# Patient Record
Sex: Female | Born: 1975 | Race: Black or African American | Hispanic: No | Marital: Single | Smoking: Never smoker
Health system: Southern US, Community
[De-identification: ages and names within clinical notes are randomized; demographics above are authoritative.]

## PROBLEM LIST (undated history)

## (undated) DIAGNOSIS — G7000 Myasthenia gravis without (acute) exacerbation: Secondary | ICD-10-CM

## (undated) DIAGNOSIS — E039 Hypothyroidism, unspecified: Secondary | ICD-10-CM

## (undated) HISTORY — PX: OTHER SURGICAL HISTORY: SHX169

## (undated) HISTORY — PX: THYMECTOMY: SHX1063

---

## 2009-11-06 ENCOUNTER — Emergency Department (HOSPITAL_BASED_OUTPATIENT_CLINIC_OR_DEPARTMENT_OTHER): Admission: EM | Admit: 2009-11-06 | Discharge: 2009-11-06 | Payer: Self-pay | Admitting: Emergency Medicine

## 2009-12-09 ENCOUNTER — Ambulatory Visit (HOSPITAL_COMMUNITY): Admission: RE | Admit: 2009-12-09 | Discharge: 2009-12-09 | Payer: Self-pay | Admitting: Obstetrics and Gynecology

## 2010-01-30 ENCOUNTER — Emergency Department (HOSPITAL_BASED_OUTPATIENT_CLINIC_OR_DEPARTMENT_OTHER): Admission: EM | Admit: 2010-01-30 | Discharge: 2010-01-30 | Payer: Self-pay | Admitting: Emergency Medicine

## 2010-06-24 LAB — GLUCOSE, CAPILLARY: Glucose-Capillary: 82 mg/dL (ref 70–99)

## 2010-06-24 LAB — URINALYSIS, ROUTINE W REFLEX MICROSCOPIC
Bilirubin Urine: NEGATIVE
Glucose, UA: NEGATIVE mg/dL
Ketones, ur: 15 mg/dL — AB
pH: 6 (ref 5.0–8.0)

## 2010-06-24 LAB — URINE MICROSCOPIC-ADD ON

## 2010-06-24 LAB — URINE CULTURE

## 2011-02-06 DIAGNOSIS — G7000 Myasthenia gravis without (acute) exacerbation: Secondary | ICD-10-CM | POA: Insufficient documentation

## 2013-06-10 ENCOUNTER — Emergency Department (HOSPITAL_BASED_OUTPATIENT_CLINIC_OR_DEPARTMENT_OTHER): Payer: Medicaid Other

## 2013-06-10 ENCOUNTER — Emergency Department (HOSPITAL_BASED_OUTPATIENT_CLINIC_OR_DEPARTMENT_OTHER)
Admission: EM | Admit: 2013-06-10 | Discharge: 2013-06-10 | Disposition: A | Discharge status: Home / Self Care | Payer: Medicaid Other | Attending: Emergency Medicine | Admitting: Emergency Medicine

## 2013-06-10 ENCOUNTER — Encounter (HOSPITAL_BASED_OUTPATIENT_CLINIC_OR_DEPARTMENT_OTHER): Payer: Self-pay | Admitting: Emergency Medicine

## 2013-06-10 DIAGNOSIS — O9853 Other viral diseases complicating the puerperium: Secondary | ICD-10-CM | POA: Insufficient documentation

## 2013-06-10 DIAGNOSIS — Z862 Personal history of diseases of the blood and blood-forming organs and certain disorders involving the immune mechanism: Secondary | ICD-10-CM | POA: Insufficient documentation

## 2013-06-10 DIAGNOSIS — M539 Dorsopathy, unspecified: Principal | ICD-10-CM

## 2013-06-10 DIAGNOSIS — M899 Disorder of bone, unspecified: Principal | ICD-10-CM | POA: Insufficient documentation

## 2013-06-10 DIAGNOSIS — B9789 Other viral agents as the cause of diseases classified elsewhere: Secondary | ICD-10-CM | POA: Insufficient documentation

## 2013-06-10 DIAGNOSIS — Z3202 Encounter for pregnancy test, result negative: Secondary | ICD-10-CM | POA: Insufficient documentation

## 2013-06-10 DIAGNOSIS — Z8669 Personal history of other diseases of the nervous system and sense organs: Secondary | ICD-10-CM | POA: Insufficient documentation

## 2013-06-10 DIAGNOSIS — M549 Dorsalgia, unspecified: Secondary | ICD-10-CM | POA: Insufficient documentation

## 2013-06-10 DIAGNOSIS — O9989 Other specified diseases and conditions complicating pregnancy, childbirth and the puerperium: Secondary | ICD-10-CM | POA: Insufficient documentation

## 2013-06-10 DIAGNOSIS — B349 Viral infection, unspecified: Secondary | ICD-10-CM

## 2013-06-10 DIAGNOSIS — R911 Solitary pulmonary nodule: Secondary | ICD-10-CM | POA: Insufficient documentation

## 2013-06-10 DIAGNOSIS — M259 Joint disorder, unspecified: Secondary | ICD-10-CM | POA: Insufficient documentation

## 2013-06-10 DIAGNOSIS — Z8639 Personal history of other endocrine, nutritional and metabolic disease: Secondary | ICD-10-CM | POA: Insufficient documentation

## 2013-06-10 DIAGNOSIS — M7918 Myalgia, other site: Secondary | ICD-10-CM

## 2013-06-10 DIAGNOSIS — R109 Unspecified abdominal pain: Secondary | ICD-10-CM | POA: Insufficient documentation

## 2013-06-10 DIAGNOSIS — R509 Fever, unspecified: Secondary | ICD-10-CM

## 2013-06-10 HISTORY — DX: Myasthenia gravis without (acute) exacerbation: G70.00

## 2013-06-10 HISTORY — DX: Hypothyroidism, unspecified: E03.9

## 2013-06-10 LAB — PREGNANCY, URINE: Preg Test, Ur: NEGATIVE

## 2013-06-10 LAB — URINALYSIS, ROUTINE W REFLEX MICROSCOPIC
Glucose, UA: NEGATIVE mg/dL
KETONES UR: 15 mg/dL — AB
Leukocytes, UA: NEGATIVE
Nitrite: NEGATIVE
PH: 6 (ref 5.0–8.0)
Protein, ur: >300 mg/dL — AB
SPECIFIC GRAVITY, URINE: 1.031 — AB (ref 1.005–1.030)
UROBILINOGEN UA: 1 mg/dL (ref 0.0–1.0)

## 2013-06-10 LAB — URINE MICROSCOPIC-ADD ON

## 2013-06-10 MED ORDER — METHOCARBAMOL 500 MG PO TABS: 500.0000 mg | ORAL_TABLET | Freq: Two times a day (BID) | ORAL | Status: DC

## 2013-06-10 MED ORDER — NAPROXEN 500 MG PO TABS: 500.0000 mg | ORAL_TABLET | Freq: Two times a day (BID) | ORAL | Status: DC

## 2013-06-10 NOTE — ED Provider Notes (Signed)
Patient CSN: [id number]     Arrival date & time 06/10/13  1521 History   First MD Initiated Contact with Patient 06/10/13 1541     Chief Complaint  Patient presents with  . Back Pain      HPI  Presents with fever and bodyaches and right back and flank pain for the last 2 days. Has felt chilled at home. No frank riders. No cough or chest pain no shortness of breath or pleuritic discomfort. She is proximally 5 weeks status post vaginal delivery. No dysuria. Is currently menstruating/vs Lochia. No nausea or vomiting. No chest pain shortness of breath or cough.  Past Medical History  Diagnosis Date  . Myasthenia gravis   . Hypothyroid    Past Surgical History  Procedure Laterality Date  . Thymectomy      1995   History reviewed. No pertinent family history. History  Substance Use Topics  . Smoking status: Never Smoker   . Smokeless tobacco: Not on file  . Alcohol Use: No   OB History   Grav Para Term Preterm Abortions TAB SAB Ect Mult Living                 Review of Systems  Constitutional: Positive for fever. Negative for chills, diaphoresis, appetite change and fatigue.  HENT: Negative for mouth sores, sore throat and trouble swallowing.   Eyes: Negative for visual disturbance.  Respiratory: Negative for cough, chest tightness, shortness of breath and wheezing.   Cardiovascular: Negative for chest pain.  Gastrointestinal: Negative for nausea, vomiting, abdominal pain, diarrhea and abdominal distention.  Endocrine: Negative for polydipsia, polyphagia and polyuria.  Genitourinary: Positive for flank pain. Negative for dysuria, frequency and hematuria.  Musculoskeletal: Negative for gait problem.  Skin: Negative for color change, pallor and rash.  Neurological: Negative for dizziness, syncope, light-headedness and headaches.  Hematological: Does not bruise/bleed easily.  Psychiatric/Behavioral: Negative for behavioral problems and confusion.      Allergies    Magnesium-containing compounds  Home Medications   Current Outpatient Rx  Name  Route  Sig  Dispense  Refill  . methocarbamol (ROBAXIN) 500 MG tablet   Oral   Take 1 tablet (500 mg total) by mouth 2 (two) times daily.   20 tablet   0   . naproxen (NAPROSYN) 500 MG tablet   Oral   Take 1 tablet (500 mg total) by mouth 2 (two) times daily.   30 tablet   0    There were no vitals taken for this visit. Physical Exam  Constitutional: She is oriented to person, place, and time. She appears well-developed and well-nourished. No distress.  HENT:  Head: Normocephalic.  Eyes: Conjunctivae are normal. Pupils are equal, round, and reactive to light. No scleral icterus.  Neck: Normal range of motion. Neck supple. No thyromegaly present.  Cardiovascular: Normal rate and regular rhythm.  Exam reveals no gallop and no friction rub.   No murmur heard. Pulmonary/Chest: Effort normal and breath sounds normal. No respiratory distress. She has no wheezes. She has no rales.    Clear lungs. No abnormal breath sounds. No increased worker breathing.  Abdominal: Soft. Bowel sounds are normal. She exhibits no distension. There is no tenderness. There is no rebound.  Musculoskeletal: Normal range of motion.  Neurological: She is alert and oriented to person, place, and time.  Skin: Skin is warm and dry. No rash noted.  Psychiatric: She has a normal mood and affect. Her behavior is normal.    ED  Course  Procedures (including critical care time) Labs Review Labs Reviewed  URINALYSIS, ROUTINE W REFLEX MICROSCOPIC - Abnormal; Notable for the following:    Color, Urine AMBER (*)    APPearance CLOUDY (*)    Specific Gravity, Urine 1.031 (*)    Hgb urine dipstick SMALL (*)    Bilirubin Urine SMALL (*)    Ketones, ur 15 (*)    Protein, ur >300 (*)    All other components within normal limits  URINE MICROSCOPIC-ADD ON - Abnormal; Notable for the following:    Bacteria, UA MANY (*)    Casts  GRANULAR CAST (*)    All other components within normal limits  URINE CULTURE  PREGNANCY, URINE   Imaging Review Ct Abdomen Pelvis Wo Contrast  06/10/2013   CLINICAL DATA:  Hematuria  EXAM: CT ABDOMEN AND PELVIS WITHOUT CONTRAST  TECHNIQUE: Multidetector CT imaging of the abdomen and pelvis was performed following the standard protocol without intravenous contrast.  COMPARISON:  None.  FINDINGS: A 8 mm sub solid pulmonary nodule projects adjacent to a vessel within the anterior base of the lingula. Image 16 series 4, and image 53 series 5.  These findings are obtained within the limitations of a noncontrast CT.  Noncontrast evaluation of the liver, spleen, adrenals, pancreas is unremarkable.  There is no evidence of hydronephrosis, nephrolithiasis, ureterolithiasis, nor hydroureter.  There is no evidence of bowel obstruction, enteritis, colitis, diverticulitis, nor appendicitis. The appendix is identified and is unremarkable.  There is no evidence of abdominal aortic aneurysm.  There is no evidence of abdominal or pelvic free fluid, loculated fluid collections, masses, nor adenopathy.  A small fat containing umbilical hernia is appreciated. Image 50 series 2. There is no evidence of an inguinal hernia.  The osseous structures demonstrate no evidence of aggressive appearing lesions.  IMPRESSION: 1. 8 mm pulmonary nodule anterior base of the lingula. Further evaluation with nonemergent dedicated chest CT is recommended. 2. No evidence of obstructive inflammatory abnormalities. Fact containing umbilical hernia.   Electronically Signed   By: Salome Holmes M.D.   On: 06/10/2013 17:54   Dg Chest 2 View  06/10/2013   CLINICAL DATA:  Back pain, headaches, chills and sweats.  EXAM: CHEST  2 VIEW  COMPARISON:  Chest CTA 11/29/2009  FINDINGS: Prior median sternotomy is noted. The cardiac silhouette is upper limits of normal in size. The lungs are well inflated and clear. There is no evidence of pleural effusion or  pneumothorax. No acute osseous abnormality is seen.  IMPRESSION: No active cardiopulmonary disease.   Electronically Signed   By: Sebastian Ache   On: 06/10/2013 17:10     EKG Interpretation None      MDM   Final diagnoses:  Musculoskeletal pain  Fever  Viral syndrome  Pulmonary nodule    No infiltrate on chest x-ray. No stone on CT. No overt signs of infection. I think that her pain is likely musculoskeletal and that desk, and the fever, or from a viral syndrome. I discussed her pulmonary nodule with her. I referred her to her primary care physician to request outpatient CT of the chest to further delineate this. This is given to her in written form. She expressed understanding of the importance of followup for this.    Rolland Porter, MD 06/10/13 [phone]

## 2013-06-10 NOTE — ED Notes (Signed)
Pt reports back pain, headaches, chills and sweats since Monday.  Took ibuprofen 800mg  at 1pm today.

## 2013-06-10 NOTE — Discharge Instructions (Signed)
Rest and fluids. Medications for body aches. Contact your doctor for a followup visit to discuss CT scan of the chest to further evaluate the pulmonary nodule noted on your CT scan today.   Fever, Adult A fever is a temperature of 100.4 F (38 C) or above.  HOME CARE  Take fever medicine as told by your doctor. Do not  take aspirin for fever if you are younger than 36 years of age.  If you are given antibiotic medicine, take it as told. Finish the medicine even if you start to feel better.  Rest.  Drink enough fluids to keep your pee (urine) clear or pale yellow. Do not drink alcohol.  Take a bath or shower with room temperature water. Do not use ice water or alcohol sponge baths.  Wear lightweight, loose clothes. GET HELP RIGHT AWAY IF:   You are short of breath or have trouble breathing.  You are very weak.  You are dizzy or you pass out (faint).  You are very thirsty or are making little or no urine.  You have new pain.  You throw up (vomit) or have watery poop (diarrhea).  You keep throwing up or having watery poop for more than 1 to 2 days.  You have a stiff neck or light bothers your eyes.  You have a skin rash.  You have a fever or problems (symptoms) that last for more than 2 to 3 days.  You have a fever and your problems quickly get worse.  You keep throwing up the fluids you drink.  You do not feel better after 3 days.  You have new problems. MAKE SURE YOU:   Understand these instructions.  Will watch your condition.  Will get help right away if you are not doing well or get worse. Document Released: 01/03/2008 Document Revised: 06/18/2011 Document Reviewed: 01/25/2011 [facility] Patient Information 2014 [place], [place].  Myalgia, Adult Myalgia is the medical term for muscle pain. It is a symptom of many things. Nearly everyone at some time in their life has this. The most common cause for muscle pain is overuse or straining and more so when you  are not in shape. Injuries and muscle bruises cause myalgias. Muscle pain without a history of injury can also be caused by a virus. It frequently comes along with the flu. Myalgia not caused by muscle strain can be present in a large number of infectious diseases. Some autoimmune diseases like lupus and fibromyalgia can cause muscle pain. Myalgia may be mild, or severe. SYMPTOMS  The symptoms of myalgia are simply muscle pain. Most of the time this is short lived and the pain goes away without treatment. DIAGNOSIS  Myalgia is diagnosed by your caregiver by taking your history. This means you tell him when the problems began, what they are, and what has been happening. If this has not been a long term problem, your caregiver may want to watch for a while to see what will happen. If it has been long term, they may want to do additional testing. TREATMENT  The treatment depends on what the underlying cause of the muscle pain is. Often anti-inflammatory medications will help. HOME CARE INSTRUCTIONS  If the pain in your muscles came from overuse, slow down your activities until the problems go away.  Myalgia from overuse of a muscle can be treated with alternating hot and cold packs on the muscle affected or with cold for the first couple days. If either heat or cold seems  to make things worse, stop their use.  Apply ice to the sore area for 15-20 minutes, 03-04 times per day, while awake for the first 2 days of muscle soreness, or as directed. Put the ice in a plastic bag and place a towel between the bag of ice and your skin.  Only take over-the-counter or prescription medicines for pain, discomfort, or fever as directed by your caregiver.  Regular gentle exercise may help if you are not active.  Stretching before strenuous exercise can help lower the risk of myalgia. It is normal when beginning an exercise regimen to feel some muscle pain after exercising. Muscles that have not been used frequently  will be sore at first. If the pain is extreme, this may mean injury to a muscle. SEEK MEDICAL CARE IF:  You have an increase in muscle pain that is not relieved with medication.  You begin to run a temperature.  You develop nausea and vomiting.  You develop a stiff and painful neck.  You develop a rash.  You develop muscle pain after a tick bite.  You have continued muscle pain while working out even after you are in good condition. SEEK IMMEDIATE MEDICAL CARE IF: Any of your problems are getting worse and medications are not helping. MAKE SURE YOU:   Understand these instructions.  Will watch your condition.  Will get help right away if you are not doing well or get worse. Document Released: 02/15/2006 Document Revised: 06/18/2011 Document Reviewed: 05/07/2006 [facility] Patient Information 2014 [place], [place].  Pulmonary Nodule  A pulmonary nodule is a small, round spot in your lung. It is usually found when pictures of your lungs are taken for other reasons. Most pulmonary nodules are not cancerous and do not cause symptoms. Tests will be done to make sure the nodule is not cancerous. Pulmonary nodules that are not cancerous usually do not require treatment. HOME CARE   Only take medicine as told by your doctor.  Follow up with your doctor as told. GET HELP IF:  You have trouble breathing when doing activities.  You feel sick or more tired than normal.  You do not feel like eating.  You lose weight without trying to.  You have chills.  You have night sweats. GET HELP RIGHT AWAY IF:  You cannot catch your breath.  You start making whistling sounds when breathing (wheezing).  You have a cough that does not go away.  You cough up blood.  You are dizzy or feel like you are going to pass out.  You have sudden chest pain.  You have a fever or lasting symptoms for more than 2 3 days.  You have a fever and your symptoms suddenly get worse. MAKE SURE  YOU:  Understand these instructions.  Will watch your condition.  Will get help right away if you are not doing well or get worse. Document Released: 04/28/2010 Document Revised: 11/26/2012 Document Reviewed: 09/15/2012 [facility] Patient Information 2014 [place], [place].  Viral Infections A viral infection can be caused by different types of viruses.Most viral infections are not serious and resolve on their own. However, some infections may cause severe symptoms and may lead to further complications. SYMPTOMS Viruses can frequently cause:  Minor sore throat.  Aches and pains.  Headaches.  Runny nose.  Different types of rashes.  Watery eyes.  Tiredness.  Cough.  Loss of appetite.  Gastrointestinal infections, resulting in nausea, vomiting, and diarrhea. These symptoms do not respond to antibiotics because the infection  is not caused by bacteria. However, you might catch a bacterial infection following the viral infection. This is sometimes called a "superinfection." Symptoms of such a bacterial infection may include:  Worsening sore throat with pus and difficulty swallowing.  Swollen neck glands.  Chills and a high or persistent fever.  Severe headache.  Tenderness over the sinuses.  Persistent overall ill feeling (malaise), muscle aches, and tiredness (fatigue).  Persistent cough.  Yellow, green, or brown mucus production with coughing. HOME CARE INSTRUCTIONS   Only take over-the-counter or prescription medicines for pain, discomfort, diarrhea, or fever as directed by your caregiver.  Drink enough water and fluids to keep your urine clear or pale yellow. Sports drinks can provide valuable electrolytes, sugars, and hydration.  Get plenty of rest and maintain proper nutrition. Soups and broths with crackers or rice are fine. SEEK IMMEDIATE MEDICAL CARE IF:   You have severe headaches, shortness of breath, chest pain, neck pain, or an unusual rash.  You  have uncontrolled vomiting, diarrhea, or you are unable to keep down fluids.  You or your child has an oral temperature above 102 F (38.9 C), not controlled by medicine.  Your baby is older than 3 months with a rectal temperature of 102 F (38.9 C) or higher.  Your baby is 83 months old or younger with a rectal temperature of 100.4 F (38 C) or higher. MAKE SURE YOU:   Understand these instructions.  Will watch your condition.  Will get help right away if you are not doing well or get worse. Document Released: 01/03/2005 Document Revised: 06/18/2011 Document Reviewed: 07/31/2010 [facility] Patient Information 2014 [place], [place].

## 2013-06-11 LAB — URINE CULTURE
COLONY COUNT: NO GROWTH
Culture: NO GROWTH

## 2014-11-13 IMAGING — CT CT ABD-PELV W/O CM
2 of 4 series · 17 of 46 positions shown, 19 images · non-contrast
Comparison: None.

CLINICAL DATA: Hematuria

EXAM:
CT ABDOMEN AND PELVIS WITHOUT CONTRAST
TECHNIQUE: Multidetector CT imaging of the abdomen and pelvis was performed
following the standard protocol without intravenous contrast.

[Series 2: renal stone < 200 lbs 5.0 b31f · axial · 0.90mm/px · z∈[-407,+8]mm · 14 of 91 slices shown, 16 images]
[im 4/91  soft-tissue]
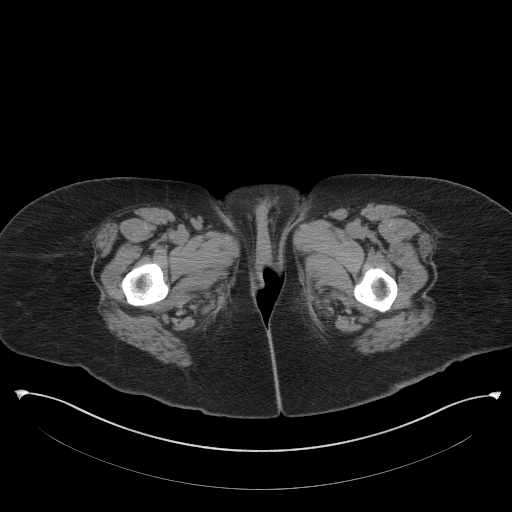
[im 4/91  bone]
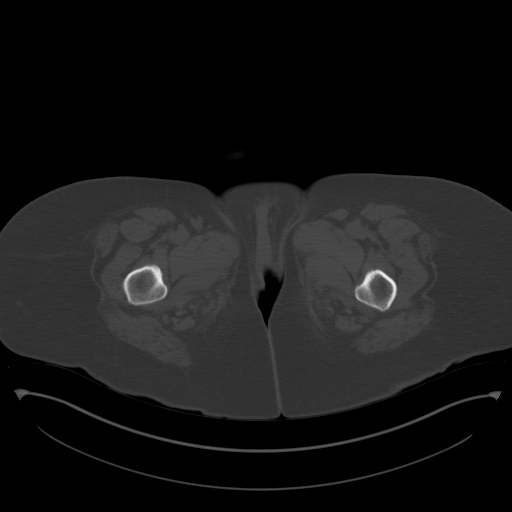
[im 12/91  soft-tissue]
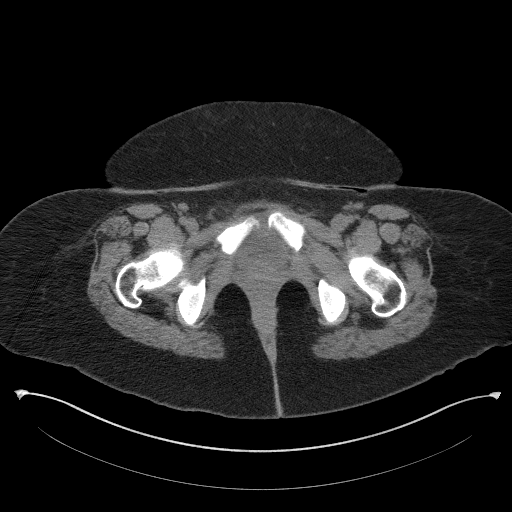
[im 16/91  soft-tissue]
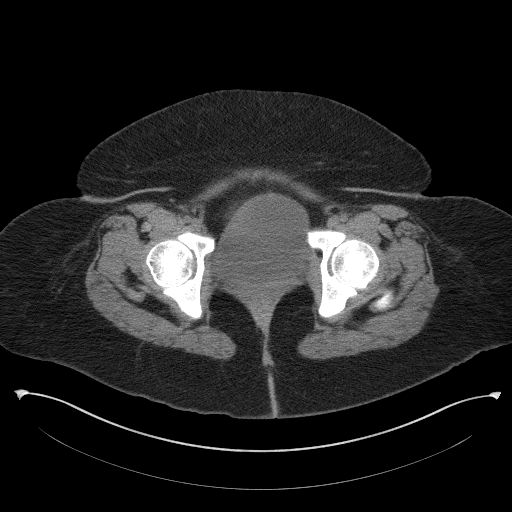
[im 24/91  soft-tissue]
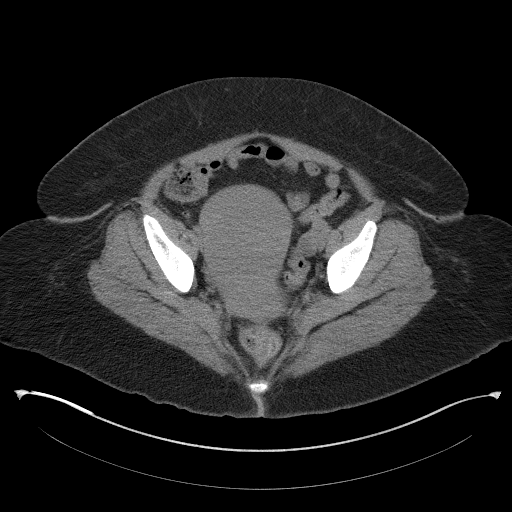
[im 32/91  soft-tissue]
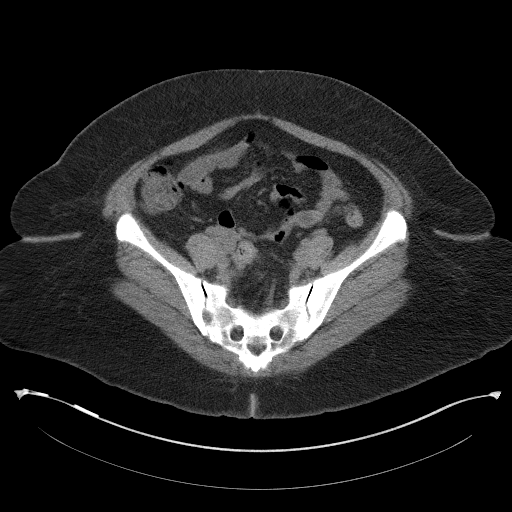
[im 36/91  soft-tissue]
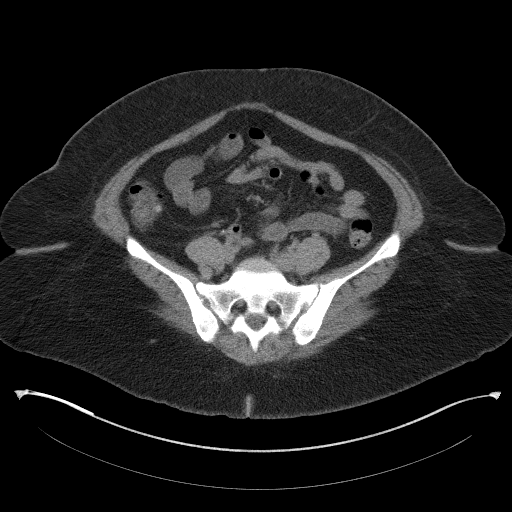
[im 44/91  soft-tissue]
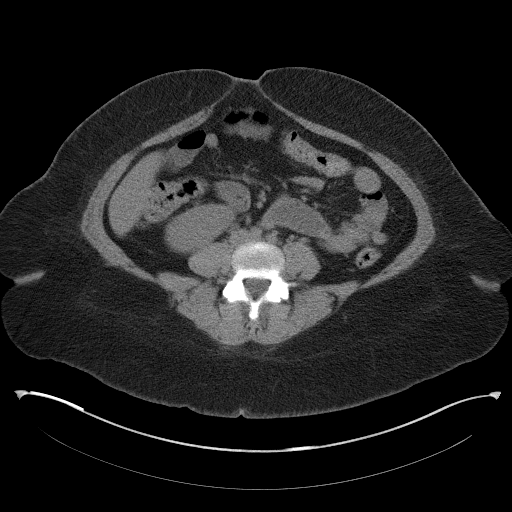
[im 47/91  soft-tissue]
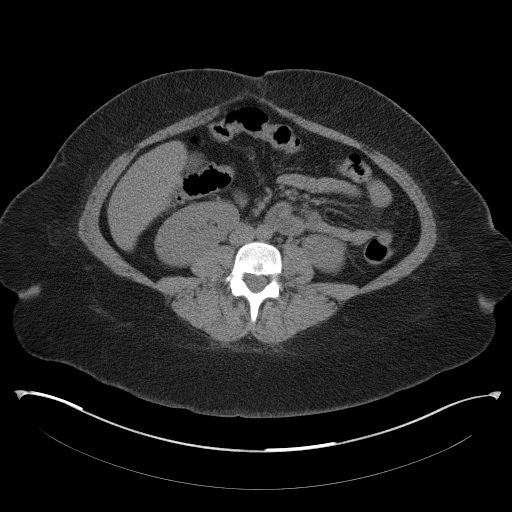
[im 55/91  soft-tissue]
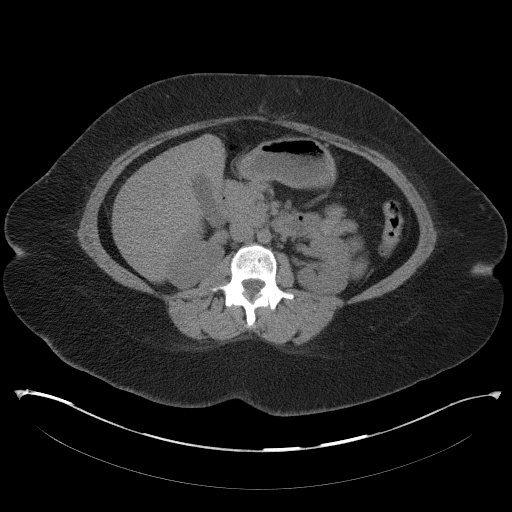
[im 55/91  bone]
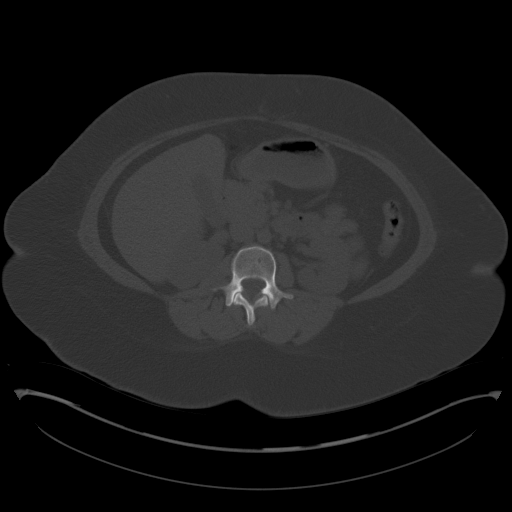
[im 59/91  soft-tissue]
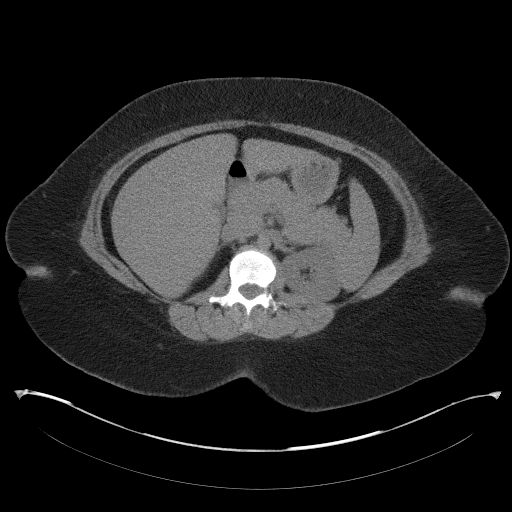
[im 67/91  soft-tissue]
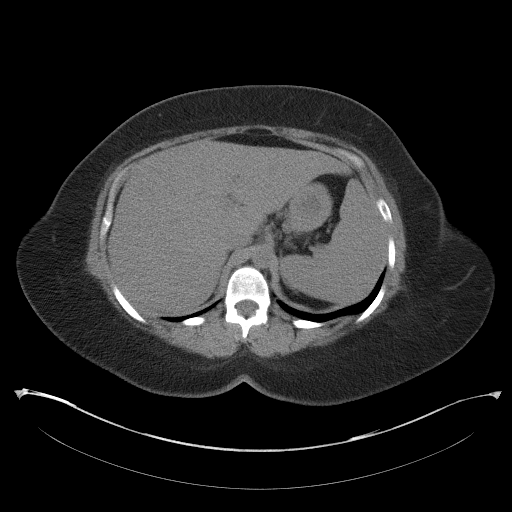
[im 75/91  soft-tissue]
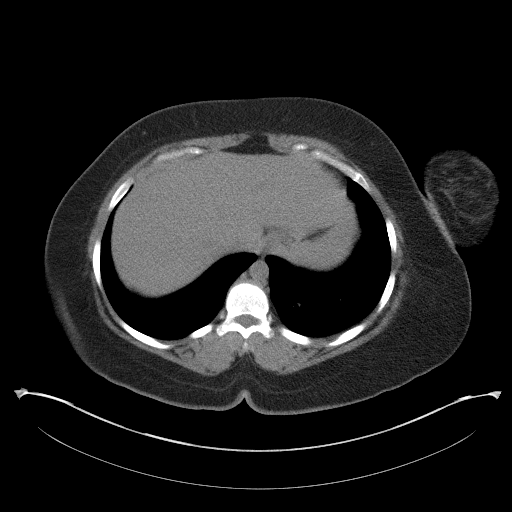
[im 79/91  soft-tissue]
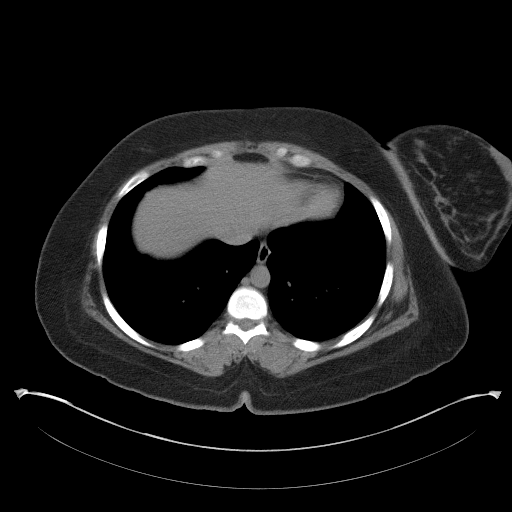
[im 87/91  soft-tissue]
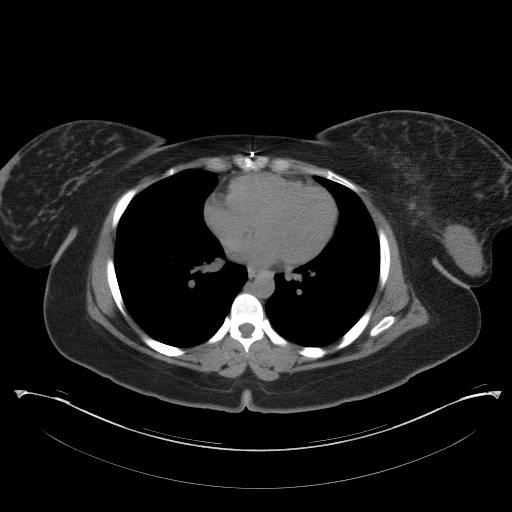

[Series 5: renal stone 3.0 coronal · coronal · 0.76mm/px · 3 of 103 slices shown]
[im 35/103  soft-tissue]
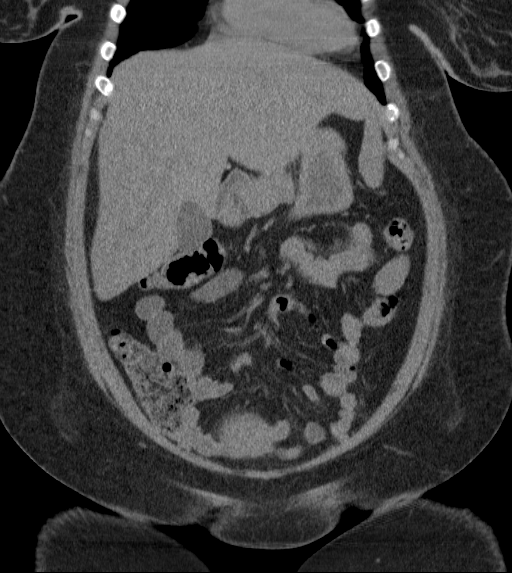
[im 46/103  soft-tissue]
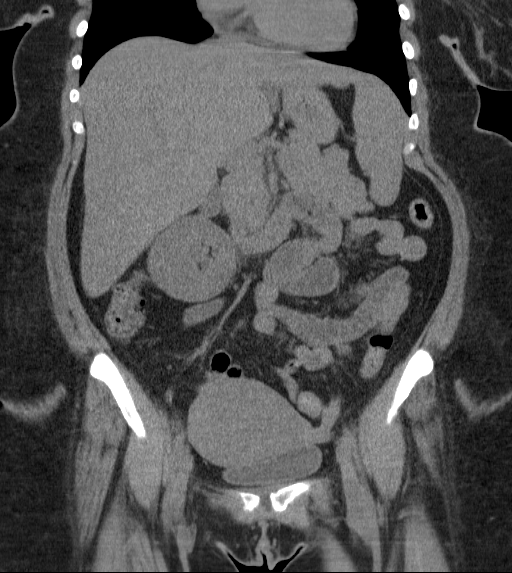
[im 57/103  soft-tissue]
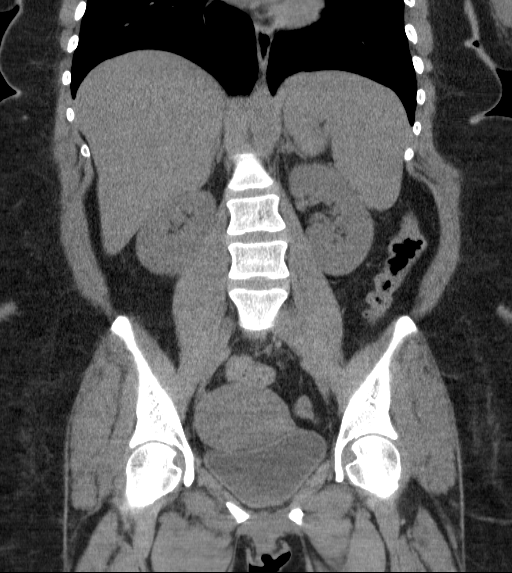

[17 of 46 positions shown; findings below may reference images not displayed]

FINDINGS: A 8 mm sub solid pulmonary nodule projects adjacent to a vessel
within the anterior base of the lingula. Image 16 series 4, and
image 53 series 5.

These findings are obtained within the limitations of a noncontrast
CT.

Noncontrast evaluation of the liver, spleen, adrenals, pancreas is
unremarkable.

There is no evidence of hydronephrosis, nephrolithiasis,
ureterolithiasis, nor hydroureter.

There is no evidence of bowel obstruction, enteritis, colitis,
diverticulitis, nor appendicitis. The appendix is identified and is
unremarkable.

There is no evidence of abdominal aortic aneurysm.

There is no evidence of abdominal or pelvic free fluid, loculated
fluid collections, masses, nor adenopathy.

A small fat containing umbilical hernia is appreciated. Image 50
series 2. There is no evidence of an inguinal hernia.

The osseous structures demonstrate no evidence of aggressive
appearing lesions.
IMPRESSION: 1. 8 mm pulmonary nodule anterior base of the lingula. Further
evaluation with nonemergent dedicated chest CT is recommended.
2. No evidence of obstructive inflammatory abnormalities. Fact
containing umbilical hernia.

## 2015-05-18 DIAGNOSIS — E039 Hypothyroidism, unspecified: Secondary | ICD-10-CM | POA: Insufficient documentation

## 2017-05-04 ENCOUNTER — Emergency Department (HOSPITAL_BASED_OUTPATIENT_CLINIC_OR_DEPARTMENT_OTHER)
Admission: EM | Admit: 2017-05-04 | Discharge: 2017-05-04 | Disposition: A | Discharge status: Home / Self Care | Payer: Medicaid Other | Attending: Emergency Medicine | Admitting: Emergency Medicine

## 2017-05-04 ENCOUNTER — Other Ambulatory Visit: Payer: Self-pay

## 2017-05-04 ENCOUNTER — Encounter (HOSPITAL_BASED_OUTPATIENT_CLINIC_OR_DEPARTMENT_OTHER): Payer: Self-pay | Admitting: Emergency Medicine

## 2017-05-04 DIAGNOSIS — Z79899 Other long term (current) drug therapy: Secondary | ICD-10-CM | POA: Insufficient documentation

## 2017-05-04 DIAGNOSIS — H6691 Otitis media, unspecified, right ear: Secondary | ICD-10-CM | POA: Insufficient documentation

## 2017-05-04 DIAGNOSIS — E039 Hypothyroidism, unspecified: Secondary | ICD-10-CM | POA: Diagnosis not present

## 2017-05-04 DIAGNOSIS — M545 Low back pain, unspecified: Secondary | ICD-10-CM

## 2017-05-04 DIAGNOSIS — H6690 Otitis media, unspecified, unspecified ear: Secondary | ICD-10-CM

## 2017-05-04 DIAGNOSIS — H9201 Otalgia, right ear: Secondary | ICD-10-CM | POA: Diagnosis present

## 2017-05-04 LAB — PREGNANCY, URINE: Preg Test, Ur: NEGATIVE

## 2017-05-04 LAB — URINALYSIS, ROUTINE W REFLEX MICROSCOPIC
Bilirubin Urine: NEGATIVE
GLUCOSE, UA: NEGATIVE mg/dL
Ketones, ur: NEGATIVE mg/dL
LEUKOCYTES UA: NEGATIVE
Nitrite: NEGATIVE
PH: 6 (ref 5.0–8.0)
PROTEIN: NEGATIVE mg/dL
Specific Gravity, Urine: 1.02 (ref 1.005–1.030)

## 2017-05-04 LAB — URINALYSIS, MICROSCOPIC (REFLEX)
BACTERIA UA: NONE SEEN
RBC / HPF: NONE SEEN RBC/hpf (ref 0–5)
SQUAMOUS EPITHELIAL / LPF: NONE SEEN
WBC, UA: NONE SEEN WBC/hpf (ref 0–5)

## 2017-05-04 MED ORDER — ACETAMINOPHEN 500 MG PO TABS
1000.0000 mg | ORAL_TABLET | Freq: Once | ORAL | Status: AC
  Administered 2017-05-04: 1000 mg via ORAL
  Filled 2017-05-04: qty 2

## 2017-05-04 MED ORDER — AMOXICILLIN 500 MG PO CAPS: 500.0000 mg | ORAL_CAPSULE | Freq: Two times a day (BID) | ORAL | 0 refills | Status: AC

## 2017-05-04 NOTE — ED Notes (Signed)
Pt refused to get into hospital gown.

## 2017-05-04 NOTE — ED Provider Notes (Signed)
[facility] EMERGENCY DEPARTMENT Provider Note   CSN: [id number] Arrival date & time: 05/04/17  0901     History   Chief Complaint Chief Complaint  Patient presents with  . Otalgia    HPI  Teresa Mora is a 42 y.o. female with a history of myasthenia gravis and hypothyroidism s/p thyroidectomy, presents to the ED for evaluation of low back pain and right ear pain. Pt reports earlier this week she was dealing with upper respiratory symptoms which have since improved, and yesterday she was feeling completely better until around 11 PM when she started having pain in her right ear, which is made worse by swallowing.  Patient denies any pain in the left ear, no ear drainage or change in hearing.  Patient reports she was having nasal congestion, cough and sore throat with the symptoms have all improved with symptomatic treatment.  Patient denies any fevers or chills, no shortness of breath or chest pain.  Patient also complaining of some left-sided low back pain for the past 3-4 days, which she describes an intermittent ache that is worse with movement patient has had musculoskeletal back pain in the past but wanted to make sure this was not a UTI.  Patient denies any dysuria, reports she drinks a lot of water and urinates frequently but does not think this is out of the norm.  She denies any vaginal discharge or pelvic discomfort.  Pt has not tried anything to treat her symptoms prior to arrival.   Pertinent negatives include no fevers or chills, abdominal pain, dysuria, numbness, weakness or tingling of lower extremities, or loss of bowel or bladder control.         Past Medical History:  Diagnosis Date  . Hypothyroid   . Myasthenia gravis (HCC)     There are no active problems to display for this patient.   Past Surgical History:  Procedure Laterality Date  . THYMECTOMY     1995    OB History    No data available       Home Medications    Prior to Admission  medications   Medication Sig Start Date End Date Taking? Authorizing Provider  levothyroxine (SYNTHROID, LEVOTHROID) 112 MCG tablet Take 224 mcg by mouth daily before breakfast.   Yes [provider]  pyridostigmine (MESTINON) 180 MG CR tablet Take 180 mg by mouth at bedtime.   Yes [provider]  pyridostigmine (MESTINON) 60 MG tablet Take 60 mg by mouth daily as needed.   Yes [provider]  amoxicillin (AMOXIL) 500 MG capsule Take 1 capsule (500 mg total) by mouth 2 (two) times daily for 7 days. 05/04/17 05/11/17  Dartha Lodge, PA-C  methocarbamol (ROBAXIN) 500 MG tablet Take 1 tablet (500 mg total) by mouth 2 (two) times daily. 06/10/13   Rolland Porter, MD  naproxen (NAPROSYN) 500 MG tablet Take 1 tablet (500 mg total) by mouth 2 (two) times daily. 06/10/13   Rolland Porter, MD    Family History No family history on file.  Social History Social History   Tobacco Use  . Smoking status: Never Smoker  . Smokeless tobacco: Never Used  Substance Use Topics  . Alcohol use: No  . Drug use: No     Allergies   Magnesium-containing compounds   Review of Systems Review of Systems  Constitutional: Negative for chills and fever.  HENT: Positive for ear pain. Negative for congestion, rhinorrhea and sore throat.   Eyes: Negative for discharge,  redness and itching.  Respiratory: Negative for cough and shortness of breath.   Cardiovascular: Negative for chest pain.  Gastrointestinal: Negative for abdominal pain, constipation, diarrhea, nausea and vomiting.  Genitourinary: Negative for dysuria, flank pain, frequency, pelvic pain, vaginal bleeding and vaginal discharge.  Musculoskeletal: Positive for back pain. Negative for gait problem.  Skin: Negative for color change and rash.  Neurological: Negative for weakness and numbness.     Physical Exam Updated Vital Signs BP 128/78 (BP Location: Left Arm)   Pulse 77   Temp 98.9 F (37.2 C) (Oral)   Resp 16   Ht 5\' 3"   (1.6 m)   Wt 116.1 kg (256 lb)   LMP 04/27/2017   SpO2 100%   BMI 45.35 kg/m   Physical Exam  Constitutional: She appears well-developed and well-nourished. No distress.  HENT:  Head: Normocephalic and atraumatic.  Right ear minimal tenderness with palpation of the tragus and manipulation of the auricle, external auditory canal is unremarkable, TM is erythematous and bulging Left Ear no tenderness to palpation, EACs unremarkable, TM normal with good cone of light Nasal mucosa with moderate edema and some clear rhinorrhea Posterior oropharynx clear and moist no tonsillar edema or exudates, uvula midline  Eyes: Right eye exhibits no discharge. Left eye exhibits no discharge.  Neck: Neck supple.  Cardiovascular: Normal rate, regular rhythm and normal heart sounds.  Pulmonary/Chest: Effort normal and breath sounds normal. No stridor. No respiratory distress. She has no wheezes. She has no rales.  Abdominal: Soft. Bowel sounds are normal. She exhibits no distension and no mass. There is no tenderness. There is no guarding.  Musculoskeletal:  Lower back with mild tenderness to palpation over the left side of the back, no midline spinal tenderness over the lumbar spine, normal range of motion of lower extremities  Lymphadenopathy:    She has no cervical adenopathy.  Neurological: She is alert. Coordination normal.  Bilateral lower extremities with normal strength and sensation  Skin: Skin is warm and dry. She is not diaphoretic.  Psychiatric: She has a normal mood and affect. Her behavior is normal.  Nursing note and vitals reviewed.    ED Treatments / Results  Labs (all labs ordered are listed, but only abnormal results are displayed) Labs Reviewed  PREGNANCY, URINE  URINALYSIS, ROUTINE W REFLEX MICROSCOPIC    EKG  EKG Interpretation None       Radiology No results found.  Procedures Procedures (including critical care time)  Medications Ordered in ED Medications    acetaminophen (TYLENOL) tablet 1,000 mg (1,000 mg Oral Given 05/04/17 0944)     Initial Impression / Assessment and Plan / ED Course  I have reviewed the triage vital signs and the nursing notes.  Pertinent labs & imaging results that were available during my care of the patient were reviewed by me and considered in my medical decision making (see chart for details).  Patient presents for evaluation of right ear pain and low back pain.  Vitals are normal and patient is overall well-appearing.  Exam consistent with right otitis media with erythematous and bulging TM, left TM normal, no concern for acute mastoiditis, meningitis.  No antibiotic use in the last month, will treat with amoxicillin.  Return precautions discussed.  Patient complaining of left-sided low back pain for the past 3-4 days history of previous musculoskeletal back pain, wishes to rule out UTI.  No urinary symptoms, abdominal pain, fevers or chills.  No neurologic deficits. UA without signs of infection.  Encouraged patient to treat back pain with ibuprofen or Tylenol, heat and ice.  Return precautions discussed.  Patient expressed understanding and is in agreement with plan at this time patient is stable for discharge home, patient to follow-up with her PCP.  Final Clinical Impressions(s) / ED Diagnoses   Final diagnoses:  Acute otitis media, unspecified otitis media type  Acute left-sided low back pain without sciatica    ED Discharge Orders        Ordered    amoxicillin (AMOXIL) 500 MG capsule  2 times daily     05/04/17 0938       Dartha LodgeFord, Ajaya Crutchfield N, PA-C 05/04/17 1016    Terrilee FilesButler, Michael C, MD 05/05/17 1553

## 2017-05-04 NOTE — Discharge Instructions (Addendum)
Urinalysis did not show any signs of UTI.  Please complete entire course of antibiotics for ear infection, if you have worsening ear pain, redness or swelling behind the ear fevers or chills, severe headache or other new or concerning symptoms return to the ED, or if you have weakness, numbness or tingling in your lower extremities or difficulty controlling her bowel or bladder.  Please take ibuprofen and/or Tylenol for your back pain you may use ice or heat to help relieve this pain and light stretching try and avoid forward bending.  Please follow-up with your primary care doctor.

## 2017-05-04 NOTE — ED Triage Notes (Signed)
Pt c/o RT ear pain since last pm 

## 2017-05-05 ENCOUNTER — Telehealth (HOSPITAL_BASED_OUTPATIENT_CLINIC_OR_DEPARTMENT_OTHER): Payer: Self-pay | Admitting: Emergency Medicine

## 2017-12-05 DIAGNOSIS — H9041 Sensorineural hearing loss, unilateral, right ear, with unrestricted hearing on the contralateral side: Secondary | ICD-10-CM | POA: Insufficient documentation

## 2021-06-26 NOTE — Progress Notes (Signed)
? ?GYNECOLOGY OFFICE VISIT NOTE ? ?History:  ? Teresa Mora is a 46 y.o. G4P3 here today for spotting. It has been present for 2 months.  ? ?For her cycle, she has regular periods that do not have too many cramps and are not too heavy. She has had spotting for the last two months but it may have resolved after her last cycle. She does also note vaginal odor but no itch. She has been wearing a liner to help with it. She also notes vaginal dryness that has been ongoing. She has tried Replens without help and water based lubricant.  ? ?She denies PCB.  ? ?For imaging, she had a CT in 2015 which was normal for pelvic imaging per my review of the report.  ? ?She denies any abnormal vaginal discharge, pelvic pain. ? ? ?  ?Past Medical History:  ?Diagnosis Date  ? Hypothyroid   ? Myasthenia gravis (Teresa Mora)   ? Myasthenia gravis (Teresa Mora)   ? ? ?Past Surgical History:  ?Procedure Laterality Date  ? lapartomy    ? THYMECTOMY    ? 1995  ? ? ?The following portions of the patient's history were reviewed and updated as appropriate: allergies, current medications, past family history, past medical history, past Teresa history, past surgical history and problem list.  ? ?Health Maintenance:   ?On review of CareEverywhere - last pap appears to have been in 2007.  ? ?Normal mammogram on 06/15/2021.  ? ?Review of Systems:  ?Pertinent items noted in HPI and remainder of comprehensive ROS otherwise negative. ? ?Physical Exam:  ?BP 115/77   Pulse 86   Resp 16   Ht 5\' 3"  (1.6 m)   Wt 234 lb (106.1 kg)   LMP 06/15/2021   BMI 41.45 kg/m?  ?CONSTITUTIONAL: Well-developed, well-nourished female in no acute distress.  ?HEENT:  Normocephalic, atraumatic. External right and left ear normal. No scleral icterus.  ?NECK: Normal range of motion, supple, no masses noted on observation ?SKIN: No rash noted. Not diaphoretic. No erythema. No pallor. ?MUSCULOSKELETAL: Normal range of motion. No edema noted. ?NEUROLOGIC: Alert and oriented to person,  place, and time. Normal muscle tone coordination. No cranial nerve deficit noted. ?PSYCHIATRIC: Normal mood and affect. Normal behavior. Normal judgment and thought content. ? ?CARDIOVASCULAR: Normal heart rate noted ?RESPIRATORY: Effort and breath sounds normal, no problems with respiration noted ?ABDOMEN: No masses noted. No other overt distention noted.   ? ?PELVIC: Normal appearing external genitalia except vitiligo on the labia at the apex and along the length of the labia bilaterally, some on perineum - pt reports this has been present for years; normal urethral meatus; normal appearing vaginal mucosa and cervix.  No abnormal discharge noted.  Normal uterine size, no other palpable masses, no uterine or adnexal tenderness. Performed in the presence of a chaperone ? ?Labs and Imaging ?Results for orders placed or performed in visit on 06/29/21 (from the past 168 hour(s))  ?POCT urine pregnancy  ? Collection Time: 06/29/21  3:39 PM  ?Result Value Ref Range  ? Preg Test, Ur Negative Negative  ? ?No results found.  ?Assessment and Plan:  ?Teresa Mora was seen today for aub and vaginal dryness. ? ?Diagnoses and all orders for this visit: ? ?Abnormal uterine bleeding (AUB) ?-     POCT urine pregnancy ?-     Cervicovaginal ancillary only( Teresa Mora) ?-     Cytology - PAP( Teresa Mora) ?- I had considered EMB but not indicated at this time. We will  first address possible odor and vaginal infection as cause of spotting. If cultures are negative and spotting resumes, I would check saline infused [place].  ? ?Vitiligo ?- Reviewed would be important to inform PCP of vitiligo as it can be associated with other conditions and since primarily vulvar, PCP may be unaware.  ? ?Vaginal odor ?- Cultures done today as noted above ?- May need to change soaps to be cleansers if persistent and cx negative ? ?Vaginal dryness ?- Vaginal dryness currently unknown cause ?- Recommended OTC lubricants i.e. silicone or oil based. Samples given.  ?-  We will rule out infectious cause.  ?- Discussed if these initial options fail, would also consider vaginal estrogen even though not yet postmenopausal, she may still find benefit. Discussed different formulations of vaginal estrogen.  ? ?Routine preventative health maintenance measures emphasized. ?Please refer to After Visit Summary for other counseling recommendations.  ? ?Return if symptoms worsen or fail to improve. ? ?Teresa Gunning, MD, Teresa Mora ?Obstetrician Teresa Mora, [facility] ?Teresa Mora ? ? ? ? ? ?

## 2021-06-29 ENCOUNTER — Other Ambulatory Visit (HOSPITAL_COMMUNITY)
Admission: RE | Admit: 2021-06-29 | Discharge: 2021-06-29 | Disposition: A | Discharge status: Home / Self Care | Payer: Medicaid Other | Source: Ambulatory Visit | Attending: Obstetrics and Gynecology | Admitting: Obstetrics and Gynecology

## 2021-06-29 ENCOUNTER — Other Ambulatory Visit: Payer: Self-pay

## 2021-06-29 ENCOUNTER — Encounter: Payer: Self-pay | Admitting: Obstetrics and Gynecology

## 2021-06-29 ENCOUNTER — Ambulatory Visit: Payer: Medicaid Other | Admitting: Obstetrics and Gynecology

## 2021-06-29 VITALS — BP 115/77 | HR 86 | Resp 16 | Ht 63.0 in | Wt 234.0 lb

## 2021-06-29 DIAGNOSIS — N898 Other specified noninflammatory disorders of vagina: Secondary | ICD-10-CM | POA: Diagnosis not present

## 2021-06-29 DIAGNOSIS — L80 Vitiligo: Secondary | ICD-10-CM

## 2021-06-29 DIAGNOSIS — N939 Abnormal uterine and vaginal bleeding, unspecified: Secondary | ICD-10-CM | POA: Insufficient documentation

## 2021-06-29 LAB — POCT URINE PREGNANCY: Preg Test, Ur: NEGATIVE

## 2021-06-30 LAB — CERVICOVAGINAL ANCILLARY ONLY
Bacterial Vaginitis (gardnerella): POSITIVE — AB
Candida Glabrata: NEGATIVE
Candida Vaginitis: NEGATIVE
Chlamydia: NEGATIVE
Comment: NEGATIVE
Comment: NEGATIVE
Comment: NEGATIVE
Comment: NEGATIVE
Comment: NEGATIVE
Comment: NORMAL
Neisseria Gonorrhea: NEGATIVE
Trichomonas: NEGATIVE

## 2021-06-30 MED ORDER — METRONIDAZOLE 500 MG PO TABS: 500.0000 mg | ORAL_TABLET | Freq: Two times a day (BID) | ORAL | 0 refills | Status: DC

## 2021-06-30 NOTE — Addendum Note (Signed)
Addended by: Radene Gunning A on: 06/30/2021 05:43 PM ? ? Modules accepted: Orders ? ?

## 2021-07-06 LAB — CYTOLOGY - PAP
Comment: NEGATIVE
Diagnosis: NEGATIVE
High risk HPV: NEGATIVE

## 2021-08-24 ENCOUNTER — Telehealth: Payer: Medicaid Other | Admitting: Obstetrics and Gynecology

## 2021-08-24 ENCOUNTER — Other Ambulatory Visit: Payer: Self-pay

## 2021-08-24 DIAGNOSIS — Z789 Other specified health status: Secondary | ICD-10-CM

## 2021-08-24 MED ORDER — NORGESTIMATE-ETH ESTRADIOL 0.25-35 MG-MCG PO TABS: 1.0000 | ORAL_TABLET | Freq: Every day | ORAL | 11 refills | Status: DC

## 2021-08-24 NOTE — Progress Notes (Signed)
OCP sent in per Dr.Duncan

## 2021-10-07 ENCOUNTER — Emergency Department (HOSPITAL_COMMUNITY)
Admission: EM | Admit: 2021-10-07 | Discharge: 2021-10-08 | Disposition: A | Discharge status: Home / Self Care | Payer: Medicaid Other | Attending: Emergency Medicine | Admitting: Emergency Medicine

## 2021-10-07 DIAGNOSIS — S299XXA Unspecified injury of thorax, initial encounter: Secondary | ICD-10-CM | POA: Diagnosis not present

## 2021-10-07 DIAGNOSIS — Z79899 Other long term (current) drug therapy: Secondary | ICD-10-CM | POA: Insufficient documentation

## 2021-10-07 DIAGNOSIS — R932 Abnormal findings on diagnostic imaging of liver and biliary tract: Secondary | ICD-10-CM | POA: Insufficient documentation

## 2021-10-07 DIAGNOSIS — S93401A Sprain of unspecified ligament of right ankle, initial encounter: Secondary | ICD-10-CM | POA: Diagnosis not present

## 2021-10-07 DIAGNOSIS — Y92410 Unspecified street and highway as the place of occurrence of the external cause: Secondary | ICD-10-CM | POA: Insufficient documentation

## 2021-10-07 DIAGNOSIS — S3991XA Unspecified injury of abdomen, initial encounter: Secondary | ICD-10-CM | POA: Diagnosis not present

## 2021-10-07 DIAGNOSIS — K769 Liver disease, unspecified: Secondary | ICD-10-CM

## 2021-10-07 DIAGNOSIS — S298XXA Other specified injuries of thorax, initial encounter: Secondary | ICD-10-CM

## 2021-10-08 ENCOUNTER — Emergency Department (HOSPITAL_COMMUNITY): Payer: Medicaid Other

## 2021-10-08 ENCOUNTER — Encounter (HOSPITAL_COMMUNITY): Payer: Self-pay | Admitting: *Deleted

## 2021-10-08 ENCOUNTER — Other Ambulatory Visit: Payer: Self-pay

## 2021-10-08 LAB — URINALYSIS, ROUTINE W REFLEX MICROSCOPIC
Bilirubin Urine: NEGATIVE
Glucose, UA: NEGATIVE mg/dL
Ketones, ur: NEGATIVE mg/dL
Leukocytes,Ua: NEGATIVE
Nitrite: NEGATIVE
Protein, ur: 30 mg/dL — AB
Specific Gravity, Urine: 1.02 (ref 1.005–1.030)
pH: 5 (ref 5.0–8.0)

## 2021-10-08 LAB — I-STAT BETA HCG BLOOD, ED (MC, WL, AP ONLY): I-stat hCG, quantitative: <5 m[IU]/mL (ref <5)

## 2021-10-08 LAB — COMPREHENSIVE METABOLIC PANEL
ALT: 21 U/L (ref 0–44)
AST: 31 U/L (ref 15–41)
Albumin: 3.5 g/dL (ref 3.5–5.0)
Alkaline Phosphatase: 40 U/L (ref 38–126)
Anion gap: 11 (ref 5–15)
BUN: 14 mg/dL (ref 6–20)
CO2: 21 mmol/L — ABNORMAL LOW (ref 22–32)
Calcium: 8.8 mg/dL — ABNORMAL LOW (ref 8.9–10.3)
Chloride: 111 mmol/L (ref 98–111)
Creatinine, Ser: 1.01 mg/dL — ABNORMAL HIGH (ref 0.44–1.00)
GFR, Estimated: >60 mL/min (ref >60)
Glucose, Bld: 80 mg/dL (ref 70–99)
Potassium: 3.1 mmol/L — ABNORMAL LOW (ref 3.5–5.1)
Sodium: 143 mmol/L (ref 135–145)
Total Bilirubin: 0.6 mg/dL (ref 0.3–1.2)
Total Protein: 7 g/dL (ref 6.5–8.1)

## 2021-10-08 LAB — PREGNANCY, URINE: Preg Test, Ur: NEGATIVE

## 2021-10-08 LAB — CBC
HCT: 38.9 % (ref 36.0–46.0)
Hemoglobin: 12.6 g/dL (ref 12.0–15.0)
MCH: 23.9 pg — ABNORMAL LOW (ref 26.0–34.0)
MCHC: 32.4 g/dL (ref 30.0–36.0)
MCV: 73.8 fL — ABNORMAL LOW (ref 80.0–100.0)
Platelets: 398 10*3/uL (ref 150–400)
RBC: 5.27 MIL/uL — ABNORMAL HIGH (ref 3.87–5.11)
RDW: 17.3 % — ABNORMAL HIGH (ref 11.5–15.5)
WBC: 13.5 10*3/uL — ABNORMAL HIGH (ref 4.0–10.5)
nRBC: 0 % (ref 0.0–0.2)

## 2021-10-08 LAB — ETHANOL: Alcohol, Ethyl (B): <10 mg/dL (ref <10)

## 2021-10-08 LAB — RAPID URINE DRUG SCREEN, HOSP PERFORMED
Amphetamines: POSITIVE — AB
Barbiturates: NOT DETECTED
Benzodiazepines: NOT DETECTED
Cocaine: NOT DETECTED
Opiates: NOT DETECTED
Tetrahydrocannabinol: NOT DETECTED

## 2021-10-08 MED ORDER — IOHEXOL 300 MG/ML  SOLN
100.0000 mL | Freq: Once | INTRAMUSCULAR | Status: AC | PRN
  Administered 2021-10-08: 100 mL via INTRAVENOUS

## 2021-10-08 MED ORDER — FENTANYL CITRATE PF 50 MCG/ML IJ SOSY
50.0000 ug | PREFILLED_SYRINGE | Freq: Once | INTRAMUSCULAR | Status: AC
  Administered 2021-10-08: 50 ug via INTRAVENOUS
  Filled 2021-10-08: qty 1

## 2021-10-08 MED ORDER — HYDROCODONE-ACETAMINOPHEN 5-325 MG PO TABS
1.0000 | ORAL_TABLET | Freq: Once | ORAL | Status: AC
  Administered 2021-10-08: 1 via ORAL
  Filled 2021-10-08: qty 1

## 2021-10-08 MED ORDER — IBUPROFEN 600 MG PO TABS: 600.0000 mg | ORAL_TABLET | Freq: Three times a day (TID) | ORAL | 0 refills | Status: AC
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
## 2021-10-08 NOTE — ED Notes (Signed)
IV team at bedside for IV placement  

## 2022-09-10 ENCOUNTER — Other Ambulatory Visit: Payer: Self-pay | Admitting: Obstetrics and Gynecology

## 2022-09-10 DIAGNOSIS — Z789 Other specified health status: Secondary | ICD-10-CM

## 2022-11-01 ENCOUNTER — Other Ambulatory Visit: Payer: Self-pay | Admitting: *Deleted

## 2022-11-01 DIAGNOSIS — Z789 Other specified health status: Secondary | ICD-10-CM

## 2022-11-01 MED ORDER — NORGESTIMATE-ETH ESTRADIOL 0.25-35 MG-MCG PO TABS: 1.0000 | ORAL_TABLET | Freq: Every day | ORAL | 0 refills | Status: DC

## 2022-11-14 ENCOUNTER — Ambulatory Visit: Payer: Medicaid Other | Admitting: Obstetrics and Gynecology

## 2022-11-14 ENCOUNTER — Encounter: Payer: Self-pay | Admitting: Obstetrics and Gynecology

## 2022-11-14 VITALS — BP 137/84 | HR 83 | Ht 63.0 in | Wt 214.0 lb

## 2022-11-14 DIAGNOSIS — Z01419 Encounter for gynecological examination (general) (routine) without abnormal findings: Secondary | ICD-10-CM

## 2022-11-14 DIAGNOSIS — Z789 Other specified health status: Secondary | ICD-10-CM | POA: Diagnosis not present

## 2022-11-14 MED ORDER — NORGESTIMATE-ETH ESTRADIOL 0.25-35 MG-MCG PO TABS: 1.0000 | ORAL_TABLET | Freq: Every day | ORAL | 11 refills | Status: DC

## 2022-11-14 NOTE — Progress Notes (Addendum)
   ANNUAL EXAM Patient name: Teresa Mora MRN [id number]  Date of birth: Mar 30, 1976 Chief Complaint:   Annual Exam  History of Present Illness:   Teresa Mora is a 47 y.o. [phone] with Patient's last menstrual period was 10/30/2022 (approximate). being seen today for a routine annual exam.  Current complaints: None   Upstream - 11/14/22 1637       Contraception Wrap Up   Current Method Oral Contraceptive    End Method Oral Contraceptive    Contraception Counseling Provided Yes    How was the end contraceptive method provided? Prescription            The pregnancy intention screening data noted above was reviewed. Potential methods of contraception were discussed. The patient elected to proceed with Oral Contraceptive.   Last pap 06/29/21. Results were: NILM w/ HRHPV negative. H/O abnormal pap: no Last mammogram: 06/15/21. Results were: normal. Family h/o breast cancer: no Last colonoscopy: never. Results were: N/A. Family h/o colorectal cancer: no      No data to display               No data to display           Review of Systems:   Pertinent items are noted in HPI Denies any headaches, blurred vision, fatigue, shortness of breath, chest pain, abdominal pain, abnormal vaginal discharge/itching/odor/irritation, problems with periods, bowel movements, urination, or intercourse unless otherwise stated above. Pertinent History Reviewed:  Reviewed past medical,surgical, social and family history.  Reviewed problem list, medications and allergies. Physical Assessment:   Vitals:   11/14/22 1559  BP: 137/84  Pulse: 83  Weight: 214 lb (97.1 kg)  Height: 5\' 3"  (1.6 m)  Body mass index is 37.91 kg/m.        Physical Examination:   General appearance - well appearing, and in no distress  Mental status - alert, oriented to person, place, and time  Chest - respiratory effort normal  Heart - normal peripheral perfusion  Breasts - breasts appear normal, no suspicious  masses, no skin or nipple changes or axillary nodes. 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  Abdomen - soft, nontender, nondistended, no masses or organomegaly. Vertical midline incision.  Pelvic - deferred after discussion of r/b  Chaperone present for exam  No results found for this or any previous visit (from the past 24 hour(s)).  Assessment & Plan:  1) Well-Woman Exam Mammogram: schedule screening mammo as soon as possible Colonoscopy: schedule screening colonoscopy as soon as possible - has a GI that she is following with but she cancelled her CSY. Encouraged her to reschedule Pap: UTD HIV/HCV: Declined Discussed potential decreased efficacy of OCPs with liraglutide but that data is still preliminary. Discussed alternative forms of birth control including patch, ring, Depo, LNG IUD, and implant. She would like to continue OCPs for now. No contraindications at this time.   Labs/procedures today:   Orders Placed This Encounter  Procedures   MM Digital Screening   Meds:  Meds ordered this encounter  Medications   norgestimate-ethinyl estradiol (ORTHO-CYCLEN) 0.25-35 MG-MCG tablet    Sig: Take 1 tablet by mouth daily.    Dispense:  28 tablet    Refill:  11    Follow-up: Return in about 1 year (around 11/14/2023) for annual exam.  Lennart Pall, MD 11/14/2022 4:37 PM

## 2023-12-10 ENCOUNTER — Telehealth: Payer: Self-pay

## 2023-12-10 NOTE — Telephone Encounter (Signed)
 RN received message regarding request for refill. RN attempted to call patient to confirm and confirm pharmacy. RN left HIPAA compliant voicemail to return RN call.

## 2023-12-26 ENCOUNTER — Ambulatory Visit: Admitting: Obstetrics and Gynecology

## 2023-12-26 ENCOUNTER — Encounter: Payer: Self-pay | Admitting: Obstetrics and Gynecology

## 2023-12-26 VITALS — BP 137/88 | HR 101 | Ht 63.0 in | Wt 230.0 lb

## 2023-12-26 DIAGNOSIS — Z01419 Encounter for gynecological examination (general) (routine) without abnormal findings: Secondary | ICD-10-CM | POA: Diagnosis not present

## 2023-12-26 DIAGNOSIS — Z789 Other specified health status: Secondary | ICD-10-CM

## 2023-12-26 DIAGNOSIS — R319 Hematuria, unspecified: Secondary | ICD-10-CM

## 2023-12-26 MED ORDER — NORGESTIMATE-ETH ESTRADIOL 0.25-35 MG-MCG PO TABS: 1.0000 | ORAL_TABLET | Freq: Every day | ORAL | 3 refills | Status: AC

## 2023-12-26 NOTE — Progress Notes (Signed)
 ANNUAL EXAM Patient name: Teresa Mora MRN [id number]  Date of birth: May 21, 1975 Chief Complaint:   Gynecologic Exam (Pt reported when has a urine sample tested, always has traces of blood. Has been feeling more tired than normal. )  History of Present Illness:   Teresa Mora is a 48 y.o. [phone] female being seen today for a routine annual exam.   Current concerns: Notes fatigue. Recently diagnosed with Vit D deficiency and doing replacement. Working on exercising.   Current birth control: Ortho cyclen (also to help with AUB)  Patient's last menstrual period was 11/24/2023 (approximate).   Last MXR: 06/2021 in [facility].   Last Pap/Pap History:  H/O abnormal pap: no 06/2021: pap/hpv wnl  Review of Systems:   Pertinent items are noted in HPI Denies any headaches, blurred vision, fatigue, shortness of breath, chest pain, abdominal pain, abnormal vaginal discharge/itching/odor/irritation, problems with periods, bowel movements, urination, or intercourse unless otherwise stated above.  Pertinent History Reviewed:  Reviewed past medical,surgical, social and family history.  Reviewed problem list, medications and allergies. Physical Assessment:   Vitals:   12/26/23 1326  BP: 137/88  Pulse: (!) 101  Weight: 230 lb (104.3 kg)  Height: 5' 3 (1.6 m)  Body mass index is 40.74 kg/m.   Physical Examination:  General appearance - well appearing, and in no distress Mental status - alert, oriented to person, place, and time Psych:  She has a normal mood and affect Skin - warm and dry, normal color, no suspicious lesions noted Chest - effort normal Heart - normal rate  Breasts - breasts appear normal, no suspicious masses, no skin or nipple changes or axillary nodes Abdomen - soft, nontender, nondistended, no masses or organomegaly Pelvic -  not indicated VULVA: Not examined VAGINA: Not examined CERVIX: Not examined UTERUS: Not examined ADNEXA: Not examined Extremities:  No swelling  or varicosities noted  Chaperone present for exam  No results found for this or any previous visit (from the past 24 hours).  Assessment & Plan:  Teresa Mora was seen today for gynecologic exam.  Diagnoses and all orders for this visit:  Encounter for annual routine gynecological examination - Cervical cancer screening: Discussed guidelines. Pap with HPV wnl 06/2021 - STD Testing: declines - Birth Control: Discussed options and their risks, benefits and common side effects; discussed VTE with estrogen containing options. Desires: OCPs - Breast Health: Encouraged self breast awareness/SBE. Teaching provided. Discussed limits of clinical breast exam for detecting breast cancer. Rx given for MXR - Colonoscopy - Plans to go. Can't do Mag prep d/t Myasthenia gravis hx. Discussed alternatives.  - F/U 12 months and prn -     MM 3D SCREENING MAMMOGRAM BILATERAL BREAST; Future  Uses birth control -     norgestimate -ethinyl estradiol  (ORTHO-CYCLEN) 0.25-35 MG-MCG tablet; Take 1 tablet by mouth daily.  Hematuria, unspecified type Check formal UA and Ucx. If blood in absence of UTI, will refer to urology.  -     Urine Culture -     Urinalysis   Orders Placed This Encounter  Procedures   Urine Culture   MM 3D SCREENING MAMMOGRAM BILATERAL BREAST   Urinalysis    Meds:  Meds ordered this encounter  Medications   norgestimate -ethinyl estradiol  (ORTHO-CYCLEN) 0.25-35 MG-MCG tablet    Sig: Take 1 tablet by mouth daily.    Dispense:  84 tablet    Refill:  3    Follow-up: Return in about 1 year (around 12/25/2024) for annual.  Vina Solian, MD 12/26/2023  2:20 PM

## 2023-12-27 LAB — URINALYSIS
Bilirubin, UA: NEGATIVE
Glucose, UA: NEGATIVE
Nitrite, UA: NEGATIVE
Specific Gravity, UA: 1.025 (ref 1.005–1.030)
Urobilinogen, Ur: 1 mg/dL (ref 0.2–1.0)
pH, UA: 5.5 (ref 5.0–7.5)

## 2023-12-28 LAB — URINE CULTURE
